# Patient Record
Sex: Female | Born: 1995 | Race: White | Hispanic: No | Marital: Married | Smoking: Never smoker
Health system: Southern US, Community
[De-identification: ages and names within clinical notes are randomized; demographics above are authoritative.]

## PROBLEM LIST (undated history)

## (undated) DIAGNOSIS — J45909 Unspecified asthma, uncomplicated: Secondary | ICD-10-CM

## (undated) DIAGNOSIS — G43709 Chronic migraine without aura, not intractable, without status migrainosus: Secondary | ICD-10-CM

## (undated) DIAGNOSIS — K219 Gastro-esophageal reflux disease without esophagitis: Secondary | ICD-10-CM

## (undated) DIAGNOSIS — M359 Systemic involvement of connective tissue, unspecified: Secondary | ICD-10-CM

## (undated) DIAGNOSIS — E669 Obesity, unspecified: Secondary | ICD-10-CM

## (undated) DIAGNOSIS — K589 Irritable bowel syndrome without diarrhea: Secondary | ICD-10-CM

## (undated) DIAGNOSIS — IMO0002 Reserved for concepts with insufficient information to code with codable children: Secondary | ICD-10-CM

## (undated) HISTORY — DX: Systemic involvement of connective tissue, unspecified: M35.9

## (undated) HISTORY — DX: Gastro-esophageal reflux disease without esophagitis: K21.9

## (undated) HISTORY — DX: Unspecified asthma, uncomplicated: J45.909

## (undated) HISTORY — DX: Obesity, unspecified: E66.9

## (undated) HISTORY — DX: Reserved for concepts with insufficient information to code with codable children: IMO0002

## (undated) HISTORY — PX: WISDOM TOOTH EXTRACTION: SHX21

## (undated) HISTORY — DX: Chronic migraine without aura, not intractable, without status migrainosus: G43.709

---
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
## 2015-08-05 ENCOUNTER — Other Ambulatory Visit (HOSPITAL_COMMUNITY): Payer: Self-pay | Admitting: Obstetrics and Gynecology

## 2015-08-05 DIAGNOSIS — O351XX0 Maternal care for (suspected) chromosomal abnormality in fetus, not applicable or unspecified: Secondary | ICD-10-CM

## 2015-08-05 DIAGNOSIS — IMO0002 Reserved for concepts with insufficient information to code with codable children: Secondary | ICD-10-CM

## 2015-08-05 DIAGNOSIS — Z3A13 13 weeks gestation of pregnancy: Secondary | ICD-10-CM

## 2015-08-10 ENCOUNTER — Encounter (HOSPITAL_COMMUNITY): Payer: Self-pay

## 2015-08-11 ENCOUNTER — Ambulatory Visit (HOSPITAL_COMMUNITY)
Admission: RE | Admit: 2015-08-11 | Discharge: 2015-08-11 | Disposition: A | Discharge status: Home / Self Care | Payer: Medicaid Other | Source: Ambulatory Visit | Attending: Family Medicine | Admitting: Family Medicine

## 2015-08-11 ENCOUNTER — Encounter (HOSPITAL_COMMUNITY): Payer: Self-pay

## 2015-08-11 ENCOUNTER — Ambulatory Visit (HOSPITAL_COMMUNITY)
Admission: RE | Admit: 2015-08-11 | Discharge: 2015-08-11 | Disposition: A | Discharge status: Home / Self Care | Payer: Medicaid Other | Source: Ambulatory Visit | Attending: Obstetrics and Gynecology | Admitting: Obstetrics and Gynecology

## 2015-08-11 VITALS — BP 113/57 | HR 83 | Wt 255.8 lb

## 2015-08-11 DIAGNOSIS — Z3A13 13 weeks gestation of pregnancy: Secondary | ICD-10-CM | POA: Diagnosis not present

## 2015-08-11 DIAGNOSIS — O283 Abnormal ultrasonic finding on antenatal screening of mother: Secondary | ICD-10-CM | POA: Insufficient documentation

## 2015-08-11 DIAGNOSIS — IMO0002 Reserved for concepts with insufficient information to code with codable children: Secondary | ICD-10-CM

## 2015-08-11 DIAGNOSIS — Z36 Encounter for antenatal screening of mother: Secondary | ICD-10-CM | POA: Diagnosis not present

## 2015-08-11 DIAGNOSIS — O351XX0 Maternal care for (suspected) chromosomal abnormality in fetus, not applicable or unspecified: Secondary | ICD-10-CM

## 2015-08-11 DIAGNOSIS — Z3689 Encounter for other specified antenatal screening: Secondary | ICD-10-CM

## 2015-08-11 HISTORY — DX: Irritable bowel syndrome, unspecified: K58.9

## 2015-08-11 NOTE — Progress Notes (Signed)
Genetic Counseling  High-Risk Gestation Note  Appointment Date:  08/11/2015 Referred By: Lennox Solders, DO Date of Birth:  02/17/1996 Partner: Jessica Brock   Pregnancy History: [id number] Estimated Date of Delivery: 02/13/16 Estimated Gestational Age: [redacted]w[redacted]d Attending: Particia Nearing, MD   Mrs. Jessica Brock and her husband, Mr. Jessica Brock, were seen for genetic counseling because of abnormal ultrasound findings.    In summary:  Reviewed ultrasound finding today of increased nuchal translucency   Reviewed differential diagnoses for increased NT including normal variation, fetal aneuploidy, structural anomaly (fetal heart defect), and single gene conditions  Discussed significance of prior screening for fetal aneuploidy  First trimester screening previously performed; Recalculated today using the NT measurement from today's ultrasound  Within normal range for Down syndrome (1 in 870) and Trisomy 13/18 (1 in 6,067)  Offered additional screening  NIPS (Panorama)-patient elected to pursue today   Detailed ultrasound- scheduled for 09/22/15  Fetal echocardiogram- to be scheduled with [facility] in July   Discussed option of diagnostic testing  Amniocentesis-patient declined  Reviewed family history concerns  Mrs. Jessica Brock was seen for ultrasound today given that previous ultrasound at her OB office suspected an increased nuchal translucency (NT) measurement. Ultrasound today visualized the NT at 3.1 mm with a crown rump length of 73.9 mm. Complete ultrasound results reported separately.   We discussed that the fetal NT refers to a fluid filled space between the skin and soft tissues behind the cervical spine.  This space is traditionally measurable between 11 and 13.[redacted] weeks gestation and is considered enlarged at ~3 mm or greater.  This couple was counseled regarding the various common etiologies for an enlarged NT including: aneuploidy, single gene conditions,  cardiac or great vessel abnormalities, lymphatic system failure, decreased fetal movement, and fetal anemia.    We reviewed chromosomes, nondisjunction, and the common features and prognoses of Down syndrome and other aneuploidies.  Considering Jessica Brock's age, an NT of 3.1 and a CRL of 73.9 mm, the risk for fetal aneuploidy is estimated to be ~1 in 200 (0.5%).   In addition, we discussed that an NT of 3.1 is associated with an approximate 1% risk for a fetal cardiac anomaly.  We also discussed single gene conditions and that these conditions are not routinely tested for prenatally unless ultrasound findings or family history significantly increase the suspicion of a specific single gene disorder.  We briefly reviewed common inheritance patterns (dominant, recessive, and X-linked) as well as the associated risks of recurrence.     Jessica Brock previously had First trimester screening performed through her OB office. These results were recalculated today using the NT measurement from today's ultrasound. We reviewed that screening adjusts the a priori risk for chromosome conditions to provide a pregnancy specific risk assessment. They understand it is not diagnostic for these conditions and does not assess for all chromosome conditions. First trimester screening recalculation was within normal range for Down syndrome (1 in 879) and trisomy 18/13 (1 in 6067).   We reviewed other available screening and diagnostic options including noninvasive prenatal screening (NIPS)/cell free DNA (cfDNA) screening, detailed ultrasound, fetal echocardiogram, and amniocentesis.  She was counseled regarding the benefits and limitations of each option.  We reviewed the approximate 1 in 300-500 risk for complications for amniocentesis, including spontaneous pregnancy loss. Regarding NIPS, we reviewed the methodology used by the screen, the conditions for which it tests, and the detection rates and false positive rates of each.  We  discussed the possible results that the tests might provide including: positive, negative, unanticipated, and no result. Finally, they were counseled regarding the cost of each option and potential out of pocket expenses.   After consideration of all the options, she elected to proceed with NIPS (Panorama through [facility] laboratory).  Those results will be available in 8-10 days. She declined amniocentesis, given the associated risk for complications in the pregnancy. Detailed ultrasound is scheduled for 09/22/15, and fetal echocardiogram will be scheduled with [facility] in July.  They were counseled that the fetal prognosis depends on the underlying etiology of the enlarged NT and further anticipatory guidance can be provided if a diagnosis is discovered.   Jessica Brock was provided with written information regarding cystic fibrosis (CF) including the carrier frequency and incidence in the Caucasian population, the availability of carrier testing and prenatal diagnosis if indicated.  In addition, we discussed that CF is routinely screened for as part of the [place] newborn screening panel.  Discussion of carrier screening was deferred to a later date given time constraints on today's appointment.   Both family histories were reviewed and found to be contributory for a blood clotting condition for Jessica Brock relatives. He reported that his father died at age 62 years old from a blood clot and that it was due to a genetic condition that only affects males in the family. Jessica Brock reported that he has informed his physician of this history and has been evaluated. He could not recall the name of the condition at this time. We discussed that the description may be consistent with an inherited thrombophilia. We reviewed that there are several genes that can predispose individuals to blood clotting. However, we discussed that autosomal dominant inheritance is more often observed and is not  typically a condition only present in males. We reviewed that additional information is needed regarding the specific condition in order to accurately assess recurrence risk and to discuss available screening or testing that may be available to other relatives.   Jessica Brock reported a female maternal first cousin with autism. He reportedly is nonverbal and also has violent behaviors. The underlying etiology is not known for his autism. We discussed that autism is part of the spectrum of conditions referred to as Autistic spectrum disorders (ASD). We discussed that ASDs are among the most common neurodevelopmental disorders, with approximately 1 in 68 children meeting criteria for ASD, according to the Centers for Disease Control. Approximately 80% of individuals diagnosed are female. There is strong evidence that genetic factors play a critical role in development of ASD. There have been recent advances in identifying specific genetic causes of ASD, however, there are still many individuals for whom the etiology of the ASD is not known. The majority of individuals with ASD (70-80%) have essential autism. There is strong evidence that genetic factors play a critical role in development of ASD. Some individuals with ASDs are found to have causative differences in karyotype analysis, chromosomal microarray analysis, or single genes. These are more likely to be identified in individuals with complex autism spectrum disorders.  We discussed that in the absence of an identified etiology, multifactorial inheritance may be more suspected. The reported family history is likely associated with a low recurrence risk for the current pregnancy. However, additional information regarding the etiology may alter recurrence risk assessment. They understand that in the absence of an identified cause for autism, prenatal screening or testing is not typically available for autism. Without  further information regarding the provided family  history, an accurate genetic risk cannot be calculated. Further genetic counseling is warranted if more information is obtained.  Mrs. Jessica Brock denied exposure to environmental toxins or chemical agents. She denied the use of alcohol, tobacco or street drugs. She denied significant viral illnesses during the course of her pregnancy. Her medical and surgical histories were contributory for anxiety and chronic migraines. She reported that she discontinued taking medication to treat these conditions prior to the pregnancy and is followed regularly by her physician regarding this history.    I counseled this couple regarding the above risks and available options.  The approximate face-to-face time with the genetic counselor was 50 minutes.    Quinn PlowmanKaren Tashawn Laswell, MS Certified Genetic Counselor 08/11/2015

## 2015-08-12 ENCOUNTER — Other Ambulatory Visit (HOSPITAL_COMMUNITY): Payer: Self-pay

## 2015-08-12 ENCOUNTER — Encounter (HOSPITAL_COMMUNITY): Payer: Self-pay

## 2015-08-19 ENCOUNTER — Other Ambulatory Visit (HOSPITAL_COMMUNITY): Payer: Self-pay

## 2015-08-19 ENCOUNTER — Telehealth (HOSPITAL_COMMUNITY): Payer: Self-pay | Admitting: MS"

## 2015-08-19 NOTE — Telephone Encounter (Signed)
Called Jaylanni Royaltyshley A Quam to discuss her prenatal cell free DNA test results.  Mrs. Kamelia RoyaltyAshley A Hargadon had Panorama testing through [facility].  Testing was offered because of increased nuchal translucency on ultrasound.   The patient was identified by name and DOB.  We reviewed that these are within normal limits, showing a less than 1 in 10,000 risk for trisomies 21, 18 and 13, and monosomy X (Turner syndrome).  In addition, the risk for triploidy/vanishing twin and sex chromosome trisomies (47,XXX and 47,XXY) was also low risk.  Mrs. Clide DeutscherBrill elected to have cffDNA analysis for 22q11 deletion syndrome, which was also low risk (1 in 3000).  We reviewed that this testing identifies > 99% of pregnancies with trisomy 7421, trisomy 3113, sex chromosome trisomies (47,XXX and 47,XXY), and triploidy. The detection rate for trisomy 18 is 96%.  The detection rate for monosomy X is ~92%.  The false positive rate is <0.1% for all conditions. Testing was also consistent with female fetal sex.  The patient did wish to know fetal sex.  She understands that this testing does not identify all genetic conditions.  All questions were answered to her satisfaction, she was encouraged to call with additional questions or concerns.  Quinn PlowmanKaren Truth Barot, MS Certified Genetic Counselor 08/19/2015 1:02 PM

## 2015-08-22 ENCOUNTER — Other Ambulatory Visit: Payer: Self-pay | Admitting: *Deleted

## 2015-08-24 ENCOUNTER — Encounter (HOSPITAL_COMMUNITY): Payer: Self-pay

## 2015-09-22 ENCOUNTER — Ambulatory Visit (HOSPITAL_COMMUNITY): Payer: Medicaid Other

## 2015-09-23 ENCOUNTER — Ambulatory Visit (HOSPITAL_COMMUNITY)
Admission: RE | Admit: 2015-09-23 | Discharge: 2015-09-23 | Disposition: A | Discharge status: Home / Self Care | Payer: Medicaid Other | Source: Ambulatory Visit | Attending: Maternal and Fetal Medicine | Admitting: Maternal and Fetal Medicine

## 2015-09-23 ENCOUNTER — Other Ambulatory Visit (HOSPITAL_COMMUNITY): Payer: Self-pay | Admitting: Maternal and Fetal Medicine

## 2015-09-23 ENCOUNTER — Encounter (HOSPITAL_COMMUNITY): Payer: Self-pay

## 2015-09-23 VITALS — BP 110/55 | HR 73 | Wt 253.2 lb

## 2015-09-23 DIAGNOSIS — Z36 Encounter for antenatal screening of mother: Secondary | ICD-10-CM | POA: Insufficient documentation

## 2015-09-23 DIAGNOSIS — O283 Abnormal ultrasonic finding on antenatal screening of mother: Secondary | ICD-10-CM

## 2015-09-23 DIAGNOSIS — O289 Unspecified abnormal findings on antenatal screening of mother: Secondary | ICD-10-CM | POA: Diagnosis present

## 2015-09-23 DIAGNOSIS — Z3689 Encounter for other specified antenatal screening: Secondary | ICD-10-CM

## 2015-09-23 DIAGNOSIS — Z3A19 19 weeks gestation of pregnancy: Secondary | ICD-10-CM | POA: Diagnosis not present

## 2015-09-23 DIAGNOSIS — O99212 Obesity complicating pregnancy, second trimester: Secondary | ICD-10-CM | POA: Diagnosis present

## 2015-09-23 DIAGNOSIS — O34219 Maternal care for unspecified type scar from previous cesarean delivery: Secondary | ICD-10-CM | POA: Diagnosis not present

## 2016-06-14 ENCOUNTER — Encounter (HOSPITAL_COMMUNITY): Payer: Self-pay
